# Patient Record
Sex: Male | Born: 1975 | Race: White | Hispanic: No | Marital: Single | Smoking: Current every day smoker
Health system: Southern US, Community
[De-identification: ages and names within clinical notes are randomized; demographics above are authoritative.]

## PROBLEM LIST (undated history)

## (undated) DIAGNOSIS — Z789 Other specified health status: Secondary | ICD-10-CM

## (undated) HISTORY — PX: NO PAST SURGERIES: SHX2092

## (undated) HISTORY — DX: Other specified health status: Z78.9

---

## 2007-09-16 ENCOUNTER — Ambulatory Visit: Payer: Self-pay | Admitting: Cardiology

## 2018-07-02 ENCOUNTER — Emergency Department (HOSPITAL_COMMUNITY)
Admission: EM | Admit: 2018-07-02 | Discharge: 2018-07-02 | Disposition: A | Discharge status: Home / Self Care | Payer: Self-pay | Attending: Emergency Medicine | Admitting: Emergency Medicine

## 2018-07-02 ENCOUNTER — Emergency Department (HOSPITAL_COMMUNITY): Payer: Self-pay

## 2018-07-02 ENCOUNTER — Other Ambulatory Visit: Payer: Self-pay

## 2018-07-02 ENCOUNTER — Encounter (HOSPITAL_COMMUNITY): Payer: Self-pay | Admitting: Emergency Medicine

## 2018-07-02 DIAGNOSIS — I300 Acute nonspecific idiopathic pericarditis: Secondary | ICD-10-CM | POA: Insufficient documentation

## 2018-07-02 DIAGNOSIS — F17210 Nicotine dependence, cigarettes, uncomplicated: Secondary | ICD-10-CM | POA: Insufficient documentation

## 2018-07-02 LAB — COMPREHENSIVE METABOLIC PANEL
ALT: 16 U/L (ref 0–44)
ANION GAP: 8 (ref 5–15)
AST: 14 U/L — ABNORMAL LOW (ref 15–41)
Albumin: 4 g/dL (ref 3.5–5.0)
Alkaline Phosphatase: 86 U/L (ref 38–126)
BUN: 17 mg/dL (ref 6–20)
CHLORIDE: 104 mmol/L (ref 98–111)
CO2: 27 mmol/L (ref 22–32)
Calcium: 8.8 mg/dL — ABNORMAL LOW (ref 8.9–10.3)
Creatinine, Ser: 0.75 mg/dL (ref 0.61–1.24)
GFR calc Af Amer: >60 mL/min (ref >60)
GFR calc non Af Amer: >60 mL/min (ref >60)
Glucose, Bld: 101 mg/dL — ABNORMAL HIGH (ref 70–99)
Potassium: 3.8 mmol/L (ref 3.5–5.1)
Sodium: 139 mmol/L (ref 135–145)
Total Bilirubin: 0.3 mg/dL (ref 0.3–1.2)
Total Protein: 6.8 g/dL (ref 6.5–8.1)

## 2018-07-02 LAB — CBC WITH DIFFERENTIAL/PLATELET
Abs Immature Granulocytes: 0.02 10*3/uL (ref 0.00–0.07)
Basophils Absolute: 0 10*3/uL (ref 0.0–0.1)
Basophils Relative: 0 %
EOS PCT: 3 %
Eosinophils Absolute: 0.3 10*3/uL (ref 0.0–0.5)
HCT: 53.1 % — ABNORMAL HIGH (ref 39.0–52.0)
Hemoglobin: 17.2 g/dL — ABNORMAL HIGH (ref 13.0–17.0)
Immature Granulocytes: 0 %
Lymphocytes Relative: 20 %
Lymphs Abs: 2.1 10*3/uL (ref 0.7–4.0)
MCH: 29.4 pg (ref 26.0–34.0)
MCHC: 32.4 g/dL (ref 30.0–36.0)
MCV: 90.6 fL (ref 80.0–100.0)
Monocytes Absolute: 0.6 10*3/uL (ref 0.1–1.0)
Monocytes Relative: 5 %
NRBC: 0 % (ref 0.0–0.2)
Neutro Abs: 7.4 10*3/uL (ref 1.7–7.7)
Neutrophils Relative %: 72 %
PLATELETS: 232 10*3/uL (ref 150–400)
RBC: 5.86 MIL/uL — ABNORMAL HIGH (ref 4.22–5.81)
RDW: 13.7 % (ref 11.5–15.5)
WBC: 10.5 10*3/uL (ref 4.0–10.5)

## 2018-07-02 LAB — TROPONIN I
Troponin I: <0.03 ng/mL (ref <0.03)
Troponin I: <0.03 ng/mL (ref <0.03)

## 2018-07-02 LAB — D-DIMER, QUANTITATIVE: D-Dimer, Quant: 0.58 ug/mL-FEU — ABNORMAL HIGH (ref 0.00–0.50)

## 2018-07-02 MED ORDER — KETOROLAC TROMETHAMINE 30 MG/ML IJ SOLN
30.0000 mg | Freq: Once | INTRAMUSCULAR | Status: AC
  Administered 2018-07-02: 30 mg via INTRAVENOUS
  Filled 2018-07-02: qty 1

## 2018-07-02 MED ORDER — IBUPROFEN 600 MG PO TABS: 600.0000 mg | ORAL_TABLET | Freq: Three times a day (TID) | ORAL | 0 refills | Status: DC

## 2018-07-02 MED ORDER — IOHEXOL 350 MG/ML SOLN
100.0000 mL | Freq: Once | INTRAVENOUS | Status: AC | PRN
  Administered 2018-07-02: 100 mL via INTRAVENOUS

## 2018-07-02 MED ORDER — COLCHICINE 0.6 MG PO TABS: 0.6000 mg | ORAL_TABLET | Freq: Every day | ORAL | 0 refills | Status: DC

## 2018-07-02 NOTE — Discharge Instructions (Signed)
Call and make an appointment to follow-up closely with cardiology.  Take medications as prescribed.  Return immediately for any worsening pain or concerns.

## 2018-07-02 NOTE — ED Notes (Signed)
Patient transported to CT 

## 2018-07-02 NOTE — ED Triage Notes (Signed)
Pt has been having central chest pain going through to back since yesterday at 10am worse with deep breathing.  Denies cough.

## 2018-07-02 NOTE — ED Provider Notes (Signed)
[facility] EMERGENCY DEPARTMENT Provider Note   CSN: [id number] Arrival date & time: 07/02/18  1724    History   Chief Complaint Chief Complaint  Patient presents with  . Chest Pain    HPI Brent Conley is a 43 y.o. male.  HPI: A 43 year old patient presents for evaluation of chest pain. Initial onset of pain was more than 6 hours ago. The patient's chest pain is sharp and is not worse with exertion. The patient's chest pain is not middle- or left-sided, is not well-localized, is not described as heaviness/pressure/tightness and does radiate to the arms/jaw/neck. The patient does not complain of nausea and denies diaphoresis. The patient has smoked in the past 90 days and has a family history of coronary artery disease in a first-degree relative with onset less than age 49. The patient has no history of stroke, has no history of peripheral artery disease, denies any history of treated diabetes, is not hypertensive, has no history of hypercholesterolemia and does not have an elevated BMI (>=30).   HPI Patient presents with central chest pain which he describes as sharp which started at 11 AM yesterday.  Gradually worsened throughout the day.  States the pain is worse with lying flat and with deep breathing.  Had some radiation of the pain up into his neck and left shoulder and arm.  Denies recent URI.  No fever or chills.  No new lower extremity swelling or pain.  States his father died of MI in his 9s.  No recent extended travel or immobilization.    History reviewed. No pertinent past medical history.  There are no active problems to display for this patient.   History reviewed. No pertinent surgical history.      Home Medications    Prior to Admission medications   Medication Sig Start Date End Date Taking? Authorizing Provider  aspirin 81 MG chewable tablet Chew 162 mg by mouth once as needed for mild pain or moderate pain.   Yes [provider]  colchicine 0.6 MG  tablet Take 1 tablet (0.6 mg total) by mouth daily. 07/02/18   Loren Racer, MD  ibuprofen (ADVIL,MOTRIN) 600 MG tablet Take 1 tablet (600 mg total) by mouth 3 (three) times daily after meals. 07/02/18   Loren Racer, MD    Family History History reviewed. No pertinent family history.  Social History Social History   Tobacco Use  . Smoking status: Current Every Day Smoker    Packs/day: 1.00    Types: Cigarettes  . Smokeless tobacco: Never Used  Substance Use Topics  . Alcohol use: Yes    Comment: once per month  . Drug use: Never     Allergies   Tylenol [acetaminophen]   Review of Systems Review of Systems  Constitutional: Negative for chills and fever.  HENT: Negative for sore throat and trouble swallowing.   Eyes: Negative for visual disturbance.  Respiratory: Positive for shortness of breath. Negative for cough.   Cardiovascular: Positive for chest pain. Negative for palpitations and leg swelling.  Gastrointestinal: Negative for abdominal pain, constipation, diarrhea, nausea and vomiting.  Genitourinary: Negative for dysuria, flank pain and frequency.  Musculoskeletal: Negative for back pain, myalgias and neck pain.  Skin: Negative for rash and wound.  Neurological: Negative for dizziness, weakness, light-headedness, numbness and headaches.  All other systems reviewed and are negative.    Physical Exam Updated Vital Signs BP 110/75   Pulse 78   Temp 98 F (36.7 C) (Oral)  Resp 20   Ht 5\' 9"  (1.753 m)   Wt 68.5 kg   SpO2 97%   BMI 22.30 kg/m   Physical Exam Vitals signs and nursing note reviewed.  Constitutional:      General: He is not in acute distress.    Appearance: Normal appearance. He is well-developed. He is not ill-appearing.  HENT:     Head: Normocephalic and atraumatic.     Nose: Nose normal. No congestion or rhinorrhea.     Mouth/Throat:     Mouth: Mucous membranes are moist.     Pharynx: No oropharyngeal exudate or posterior  oropharyngeal erythema.  Eyes:     Extraocular Movements: Extraocular movements intact.     Pupils: Pupils are equal, round, and reactive to light.  Neck:     Musculoskeletal: Normal range of motion and neck supple. No neck rigidity or muscular tenderness.  Cardiovascular:     Rate and Rhythm: Normal rate and regular rhythm.     Pulses: Normal pulses.     Heart sounds: No murmur. Friction rub present. No gallop.      Comments: Question faint friction rub  Pulmonary:     Effort: Pulmonary effort is normal. No respiratory distress.     Breath sounds: Normal breath sounds. No stridor. No wheezing, rhonchi or rales.  Chest:     Chest wall: No tenderness.  Abdominal:     General: Bowel sounds are normal.     Palpations: Abdomen is soft.     Tenderness: There is no abdominal tenderness. There is no right CVA tenderness, left CVA tenderness, guarding or rebound.  Musculoskeletal: Normal range of motion.        General: No swelling, tenderness, deformity or signs of injury.     Right lower leg: No edema.     Left lower leg: No edema.  Lymphadenopathy:     Cervical: No cervical adenopathy.  Skin:    General: Skin is warm and dry.     Capillary Refill: Capillary refill takes less than 2 seconds.     Findings: No erythema or rash.  Neurological:     General: No focal deficit present.     Mental Status: He is alert and oriented to person, place, and time.  Psychiatric:        Mood and Affect: Mood normal.        Behavior: Behavior normal.      ED Treatments / Results  Labs (all labs ordered are listed, but only abnormal results are displayed) Labs Reviewed  CBC WITH DIFFERENTIAL/PLATELET - Abnormal; Notable for the following components:      Result Value   RBC 5.86 (*)    Hemoglobin 17.2 (*)    HCT 53.1 (*)    All other components within normal limits  COMPREHENSIVE METABOLIC PANEL - Abnormal; Notable for the following components:   Glucose, Bld 101 (*)    Calcium 8.8 (*)     AST 14 (*)    All other components within normal limits  D-DIMER, QUANTITATIVE (NOT AT [facility]) - Abnormal; Notable for the following components:   D-Dimer, Quant 0.58 (*)    All other components within normal limits  TROPONIN I  TROPONIN I    EKG EKG Interpretation  Date/Time:  Saturday July 02 2018 17:26:33 EST Ventricular Rate:  82 PR Interval:    QRS Duration: 83 QT Interval:  366 QTC Calculation: 428 R Axis:   84 Text Interpretation:  Sinus rhythm ST elev, probable normal  early repol pattern No old tracing to compare Confirmed by Loren Racer ([id number]) on 07/02/2018 6:10:22 PM   Radiology Dg Chest 2 View  Result Date: 07/02/2018 CLINICAL DATA:  Per ED notes: Pt has been having central chest pain going through to back since yesterday at 10am worse with deep breathing. Denies cough. EXAM: CHEST - 2 VIEW COMPARISON:  None. FINDINGS: Cardiomediastinal silhouette is normal. Focal atelectasis at both lung bases. No consolidations or pleural effusions. No pulmonary edema. IMPRESSION: 1. Bibasilar atelectasis. 2. Lungs otherwise clear. Electronically Signed   By: Norva Pavlov M.D.   On: 07/02/2018 18:49   Ct Angio Chest Pe W And/or Wo Contrast  Result Date: 07/02/2018 CLINICAL DATA:  Chest pain. Intermediate probability. Positive D-dimer EXAM: CT ANGIOGRAPHY CHEST WITH CONTRAST TECHNIQUE: Multidetector CT imaging of the chest was performed using the standard protocol during bolus administration of intravenous contrast. Multiplanar CT image reconstructions and MIPs were obtained to evaluate the vascular anatomy. CONTRAST:  OMNIPAQUE IOHEXOL 350 MG/ML SOLN COMPARISON:  None FINDINGS: Cardiovascular: Heart size is normal. No pericardial effusion. Calcification in the LAD coronary artery noted. The main pulmonary artery appears patent. No saddle embolus. No central obstructing pulmonary emboli identified. No lobar or segmental pulmonary artery filling defects identified.  Mediastinum/Nodes: Normal appearance of the thyroid gland. The trachea appears patent and is midline. Normal appearance of the esophagus. Small mediastinal and hilar lymph nodes are identified. No adenopathy however no axillary or supraclavicular adenopathy. Lungs/Pleura: No pleural effusion. Ground-glass attenuation and interstitial accentuation within the posterior lung bases likely represent dependent changes. No significant pulmonary edema, airspace consolidation or atelectasis. Centrilobular and paraseptal emphysema noted. Upper Abdomen: No acute abnormality. Musculoskeletal: No chest wall abnormality. No acute or significant osseous findings. Review of the MIP images confirms the above findings. IMPRESSION: 1. No evidence for acute pulmonary emboli. 2. Dependent changes identified within both lung bases. 3. Emphysema 4. Lad coronary artery calcification. Electronically Signed   By: Signa Kell M.D.   On: 07/02/2018 19:49    Procedures Procedures (including critical care time)  Medications Ordered in ED Medications  iohexol (OMNIPAQUE) 350 MG/ML injection 100 mL (100 mLs Intravenous Contrast Given 07/02/18 1909)  ketorolac (TORADOL) 30 MG/ML injection 30 mg (30 mg Intravenous Given 07/02/18 2018)     Initial Impression / Assessment and Plan / ED Course  I have reviewed the triage vital signs and the nursing notes.  Pertinent labs & imaging results that were available during my care of the patient were reviewed by me and considered in my medical decision making (see chart for details).     HEAR Score: 2 Patient has significant risk factors for coronary artery disease including family history and long history of smoking cigarettes.  However his symptoms are pleuritic in nature and positional and most consistent with pericarditis.  Patient did have an elevation in his d-dimer and had a CT Angio of the chest which showed no evidence of PE or pericardial effusion.  He does have some  calcifications of his LAD.  Troponin x2 is normal.  His heart score is 2.  Will start on colchicine and NSAIDs and have him follow-up closely with cardiology.  He has been advised to quit smoking cigarettes.  Strict return precautions have been given.   Final Clinical Impressions(s) / ED Diagnoses   Final diagnoses:  Acute idiopathic pericarditis    ED Discharge Orders         Ordered    ibuprofen (ADVIL,MOTRIN) 600 MG tablet  3 times daily after meals     07/02/18 2136    colchicine 0.6 MG tablet  Daily     07/02/18 2136           Loren Racer, MD 07/02/18 2136

## 2018-07-20 ENCOUNTER — Telehealth: Payer: Self-pay | Admitting: Cardiology

## 2018-07-20 NOTE — Telephone Encounter (Signed)
Informed Brent Conley that pt needs to be evaluated in the ED. She voiced understanding and states she will inform the pt.

## 2018-07-20 NOTE — Telephone Encounter (Signed)
Pt's fiancee, Laqueta Carina, called wanting to know if the patient could be seen sooner. Stated last night he had pains in his chest that made it feel like it was going to "bust open". Patient refused to go to the ER last night per fiancee.

## 2018-07-20 NOTE — Telephone Encounter (Signed)
   By review of notes, this patient has never been evaluated in our office. Was in the ED on 07/02/2018 for chest pain and symptoms were thought to be most consistent with pericarditis. If symptoms have significantly worsened and he is no longer taking Colchicine, I would strongly recommend that he go to the ED for further evaluation. Please route to the DOD as well for further recommendations as he is a new patient.   Signed, Ellsworth Lennox, PA-C 07/20/2018, 10:13 AM Pager: [phone]

## 2018-07-20 NOTE — Telephone Encounter (Signed)
Spoke with Misty Stanley who states that pt is experiencing increased chest pain and SOB over the last 3 days. Misty Stanley states that pt refused to be seen in ED on last night. She reports that the patient is no longer taking colchicine because after taking one dose he started to itch and had hives. Misty Stanley is requesting a sooner visit in our office. Please advise.

## 2018-07-29 ENCOUNTER — Ambulatory Visit (INDEPENDENT_AMBULATORY_CARE_PROVIDER_SITE_OTHER): Payer: Self-pay | Admitting: Cardiology

## 2018-07-29 ENCOUNTER — Encounter: Payer: Self-pay | Admitting: Cardiology

## 2018-07-29 ENCOUNTER — Ambulatory Visit (HOSPITAL_COMMUNITY)
Admission: RE | Admit: 2018-07-29 | Discharge: 2018-07-29 | Disposition: A | Discharge status: Home / Self Care | Payer: Self-pay | Source: Ambulatory Visit | Attending: Cardiology | Admitting: Cardiology

## 2018-07-29 ENCOUNTER — Other Ambulatory Visit: Payer: Self-pay

## 2018-07-29 VITALS — BP 102/67 | HR 70 | Ht 69.0 in | Wt 173.0 lb

## 2018-07-29 DIAGNOSIS — R072 Precordial pain: Secondary | ICD-10-CM | POA: Insufficient documentation

## 2018-07-29 DIAGNOSIS — Z720 Tobacco use: Secondary | ICD-10-CM

## 2018-07-29 DIAGNOSIS — Z8249 Family history of ischemic heart disease and other diseases of the circulatory system: Secondary | ICD-10-CM

## 2018-07-29 DIAGNOSIS — R079 Chest pain, unspecified: Secondary | ICD-10-CM

## 2018-07-29 LAB — ECHOCARDIOGRAM COMPLETE
Height: 69 in
Weight: 2768 oz

## 2018-07-29 NOTE — Patient Instructions (Signed)
Medication Instructions:  Stop prednisone   Start aspirin 325 mg daily   Labwork: none  Testing/Procedures: Your physician has requested that you have an echocardiogram. Echocardiography is a painless test that uses sound waves to create images of your heart. It provides your doctor with information about the size and shape of your heart and how well your heart's chambers and valves are working. This procedure takes approximately one hour. There are no restrictions for this procedure.  Your physician has requested that you have en exercise stress myoview. For further information please visit [email or web address] Please follow instruction sheet, as given.    Follow-Up: Your physician recommends that you schedule a follow-up appointment in:  Pending test results (we will call with results )    Any Other Special Instructions Will Be Listed Below (If Applicable).     If you need a refill on your cardiac medications before your next appointment, please call your pharmacy.

## 2018-07-29 NOTE — Progress Notes (Signed)
Cardiology Office Note  Date: 07/29/2018   ID: DOLORES BALDERA, DOB December 11, 1975, MRN [id number]  PCP: Lauriann Milillo Jester, DO  Consulting Cardiologist: Nona Dell, MD   Chief Complaint  Patient presents with  . Chest Pain    History of Present Illness: Brent Conley is a 43 y.o. male referred for cardiology consultation by Dr. Ranae Palms after ER visit in late February with chest discomfort.  Chest pain symptoms were reported to be pleuritic in nature and he was diagnosed with possible pericarditis at ER encounter.  Troponin I level was negative x2.  D-dimer level was increased to 0.58.  Chest CT angiography was negative for pulmonary embolus showing dependent atelectasis and emphysematous changes, also incidental coronary calcification in the LAD distribution.  ECG showed sinus rhythm with probable early repolarization.  He presents today with his girlfriend.  He tells me that approximately 3 weeks ago he developed the onset of chest tightness while he was at work sitting in a truck, no specific activity.  This waxed and waned over a period of a few days and became worse waking him up in the middle of the night.  After assessment in the ER he was placed on ibuprofen and colchicine, although was only able to take 1 dose of colchicine before developing hives and discontinuing the medication.  He did not take ibuprofen regularly either.  He did use some of his girlfriends oxycodone which seemed to help, and also took some of her prednisone as well, although has not been consistent with either medication.  Over the last few weeks he has continued to have episodic chest tightness, not continuous or positional.  This is been worse with heavy lifting.  He works with Therapist, music, has been out of work recently.  He has a family history of premature CAD in his father and brother.  He does not report any other known medical conditions but does have a history of tobacco abuse.  Repeat  ECG today shows sinus rhythm with nonspecific T wave changes.  Past Medical History:  Diagnosis Date  . Known health problems: none     Past Surgical History:  Procedure Laterality Date  . NO PAST SURGERIES      Current Outpatient Medications  Medication Sig Dispense Refill  . aspirin 325 MG EC tablet Take 325 mg by mouth daily.    . predniSONE (DELTASONE) 20 MG tablet Take 20 mg by mouth 2 (two) times daily with a meal.     No current facility-administered medications for this visit.    Allergies:  Colchicine and Tylenol [acetaminophen]   Social History: The patient  reports that he has been smoking cigarettes. He has been smoking about 1.00 pack per day. He has never used smokeless tobacco. He reports previous alcohol use. He reports that he does not use drugs.  Family History: The patient's family history includes COPD in his father; Heart attack in his brother and father.   ROS:  Please see the history of present illness. Otherwise, complete review of systems is positive for none.  All other systems are reviewed and negative.   Physical Exam: VS:  BP 102/67   Pulse 70   Ht 5\' 9"  (1.753 m)   Wt 173 lb (78.5 kg)   SpO2 97%   BMI 25.55 kg/m , BMI Body mass index is 25.55 kg/m.  Wt Readings from Last 3 Encounters:  07/29/18 173 lb (78.5 kg)  07/02/18 151 lb (68.5 kg)  General: Patient appears comfortable at rest. HEENT: Conjunctiva and lids normal, oropharynx clear. Neck: Supple, no elevated JVP or carotid bruits, no thyromegaly. Lungs: Clear to auscultation, nonlabored breathing at rest. Cardiac: Regular rate and rhythm, no S3 or significant systolic murmur, no pericardial rub. Abdomen: Soft, nontender, bowel sounds present, no guarding or rebound. Extremities: No pitting edema, distal pulses 2+. Skin: Warm and dry. Musculoskeletal: No kyphosis. Neuropsychiatric: Alert and oriented x3, affect grossly appropriate.  ECG: There are no old tracings for comparison.   Recent Labwork: 07/02/2018: ALT 16; AST 14; BUN 17; Creatinine, Ser 0.75; Hemoglobin 17.2; Platelets 232; Potassium 3.8; Sodium 139   Other Studies Reviewed Today:  Chest CTA 07/02/2018: FINDINGS: Cardiovascular: Heart size is normal. No pericardial effusion. Calcification in the LAD coronary artery noted.  The main pulmonary artery appears patent. No saddle embolus. No central obstructing pulmonary emboli identified. No lobar or segmental pulmonary artery filling defects identified.  Mediastinum/Nodes: Normal appearance of the thyroid gland. The trachea appears patent and is midline. Normal appearance of the esophagus. Small mediastinal and hilar lymph nodes are identified. No adenopathy however no axillary or supraclavicular adenopathy.  Lungs/Pleura: No pleural effusion. Ground-glass attenuation and interstitial accentuation within the posterior lung bases likely represent dependent changes. No significant pulmonary edema, airspace consolidation or atelectasis. Centrilobular and paraseptal emphysema noted.  Upper Abdomen: No acute abnormality.  Musculoskeletal: No chest wall abnormality. No acute or significant osseous findings.  Review of the MIP images confirms the above findings.  IMPRESSION: 1. No evidence for acute pulmonary emboli. 2. Dependent changes identified within both lung bases. 3. Emphysema 4. Lad coronary artery calcification.  Assessment and Plan:  1.  Intermittent chest tightness over the last 3 weeks.  Patient was initially diagnosed with pericarditis following ER visit in late February at which point troponin I levels were negative, ECG showed probable early repolarization changes.  His symptoms do not necessarily sound typical for pericarditis, description now is more of an exertional component and he does have cardiac risk factors including gender, tobacco abuse, and family history of premature CAD in first-degree male relatives.  He also had  incidentally noted coronary artery calcification in the LAD distribution by chest CT.  I have asked that he stop taking his girlfriend's prednisone for now, will start aspirin 325 mg once a day and proceed with further ischemic evaluation.  Echocardiogram and exercise Myoview will be obtained to help guide the next step in his management.  2.  Ongoing tobacco abuse, smoking cessation discussed.  Current medicines were reviewed with the patient today.   Orders Placed This Encounter  Procedures  . NM Myocar Multi W/Spect W/Wall Motion / EF  . EKG 12-Lead  . ECHOCARDIOGRAM COMPLETE    Disposition: Call with test results and further recommendations.  Signed, Jonelle Sidle, MD, [facility] 07/29/2018 11:36 AM    [facility] [address] [place], [place] [address] Phone: [phone]; Fax: [phone]

## 2018-07-29 NOTE — Progress Notes (Signed)
*  PRELIMINARY RESULTS* Echocardiogram 2D Echocardiogram has been performed.  Stacey Drain 07/29/2018, 12:32 PM

## 2018-08-01 ENCOUNTER — Other Ambulatory Visit: Payer: Self-pay

## 2018-08-01 ENCOUNTER — Telehealth: Payer: Self-pay | Admitting: *Deleted

## 2018-08-01 ENCOUNTER — Encounter (HOSPITAL_COMMUNITY)
Admission: RE | Admit: 2018-08-01 | Discharge: 2018-08-01 | Disposition: A | Discharge status: Home / Self Care | Payer: Self-pay | Source: Ambulatory Visit | Attending: Cardiology | Admitting: Cardiology

## 2018-08-01 ENCOUNTER — Telehealth: Payer: Self-pay

## 2018-08-01 ENCOUNTER — Ambulatory Visit (HOSPITAL_COMMUNITY)
Admission: RE | Admit: 2018-08-01 | Discharge: 2018-08-01 | Disposition: A | Discharge status: Home / Self Care | Payer: Self-pay | Source: Ambulatory Visit | Attending: Cardiology | Admitting: Cardiology

## 2018-08-01 DIAGNOSIS — R072 Precordial pain: Secondary | ICD-10-CM | POA: Insufficient documentation

## 2018-08-01 DIAGNOSIS — R079 Chest pain, unspecified: Secondary | ICD-10-CM | POA: Insufficient documentation

## 2018-08-01 LAB — NM MYOCAR MULTI W/SPECT W/WALL MOTION / EF
CHL CUP NUCLEAR SSS: 1
CSEPED: 11 min
Estimated workload: 13.4 METS
Exercise duration (sec): 17 s
LV dias vol: 100 mL (ref 62–150)
LV sys vol: 43 mL
MPHR: 177 {beats}/min
NUC STRESS TID: 1.04
Peak HR: 157 {beats}/min
Percent HR: 88 %
RATE: 0.41
RPE: 15
Rest HR: 59 {beats}/min
SDS: 1
SRS: 0

## 2018-08-01 MED ORDER — REGADENOSON 0.4 MG/5ML IV SOLN
INTRAVENOUS | Status: AC
  Filled 2018-08-01: qty 5

## 2018-08-01 MED ORDER — TECHNETIUM TC 99M TETROFOSMIN IV KIT
10.0000 | PACK | Freq: Once | INTRAVENOUS | Status: AC | PRN
  Administered 2018-08-01: 10.5 via INTRAVENOUS

## 2018-08-01 MED ORDER — TECHNETIUM TC 99M TETROFOSMIN IV KIT
30.0000 | PACK | Freq: Once | INTRAVENOUS | Status: AC | PRN
  Administered 2018-08-01: 31 via INTRAVENOUS

## 2018-08-01 MED ORDER — SODIUM CHLORIDE 0.9% FLUSH
INTRAVENOUS | Status: AC
  Administered 2018-08-01: 10 mL via INTRAVENOUS
  Filled 2018-08-01: qty 10

## 2018-08-01 NOTE — Telephone Encounter (Signed)
-----   Message from Jonelle Sidle, MD sent at 08/01/2018  1:04 PM EDT ----- Results reviewed.  Please let him know that the stress test was overall low risk, argues against presence of obstructive CAD as cause of his chest pain.  In that case, let's pursue a treatment course for pericarditis since he never did complete this initially.  We cannot use colchicine in light of reported allergic reaction.  Suggest ibuprofen 600 mg p.o. 3 times daily for a 10-day course.  He should stop ASA. He should take omeprazole which he can get over-the-counter during the time that he is on ibuprofen to protect his stomach.  We can schedule a follow-up phone telehealth visit in a few weeks after he has completed his treatment to see how things are going. A copy of this test should be forwarded to Samuel Jester, DO.

## 2018-08-01 NOTE — Telephone Encounter (Signed)
Called pt. No answer. Left message for pt to return call.  

## 2018-08-01 NOTE — Telephone Encounter (Signed)
Called patient with test results. No answer. Left message to call back.  

## 2018-08-01 NOTE — Telephone Encounter (Signed)
-----   Message from Jonelle Sidle, MD sent at 07/29/2018  3:44 PM EDT ----- Results reviewed.  Normal LVEF at 55 to 60% without obvious wall motion abnormalities.  Overall reassuring.  Study does not exclude the possibility that he could have had pericarditis.  Still important to pursue stress testing as discussed in consultation note. A copy of this test should be forwarded to Samuel Jester, DO.

## 2018-08-02 ENCOUNTER — Telehealth: Payer: Self-pay

## 2018-08-02 MED ORDER — IBUPROFEN 600 MG PO TABS: ORAL_TABLET | ORAL | 0 refills | Status: AC

## 2018-08-02 NOTE — Telephone Encounter (Signed)
-----   Message from Samuel G McDowell, MD sent at 08/01/2018  1:04 PM EDT ----- Results reviewed.  Please let him know that the stress test was overall low risk, argues against presence of obstructive CAD as cause of his chest pain.  In that case, let's pursue a treatment course for pericarditis since he never did complete this initially.  We cannot use colchicine in light of reported allergic reaction.  Suggest ibuprofen 600 mg p.o. 3 times daily for a 10-day course.  He should stop ASA. He should take omeprazole which he can get over-the-counter during the time that he is on ibuprofen to protect his stomach.  We can schedule a follow-up phone telehealth visit in a few weeks after he has completed his treatment to see how things are going. A copy of this test should be forwarded to Butler, Cynthia, DO. 

## 2018-08-02 NOTE — Telephone Encounter (Signed)
I gave results to both patient and wife.He will stop ASA and take Ibuprofen as directed.He will call back after 10 day therapy and we will schedule tele apt then

## 2019-10-23 IMAGING — DX DG CHEST 2V
2 series · 2 of 2 positions shown · non-contrast
Comparison: None.

CLINICAL DATA: Per ED notes: Pt has been having central chest pain
going through to back since yesterday at 10am worse with deep
breathing. Denies cough.

EXAM:
CHEST - 2 VIEW

[chest pa]
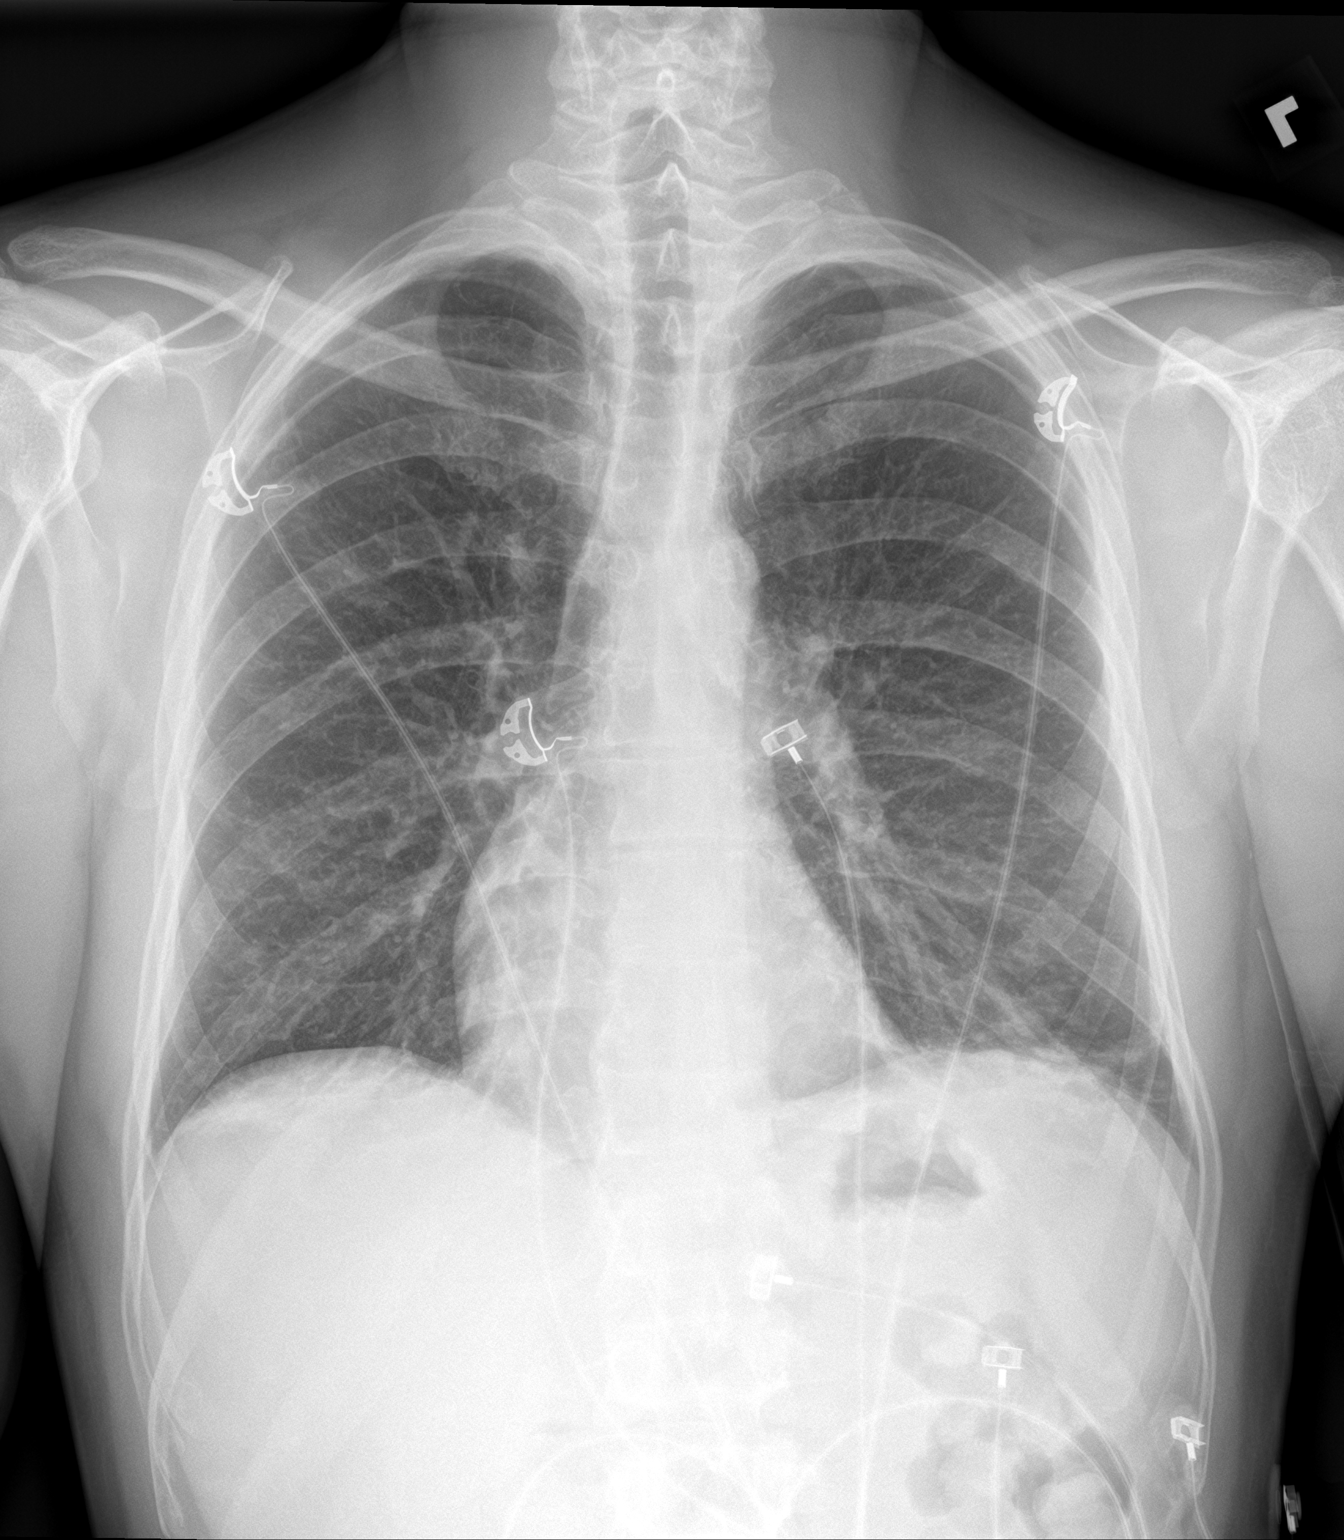

[chest lat]
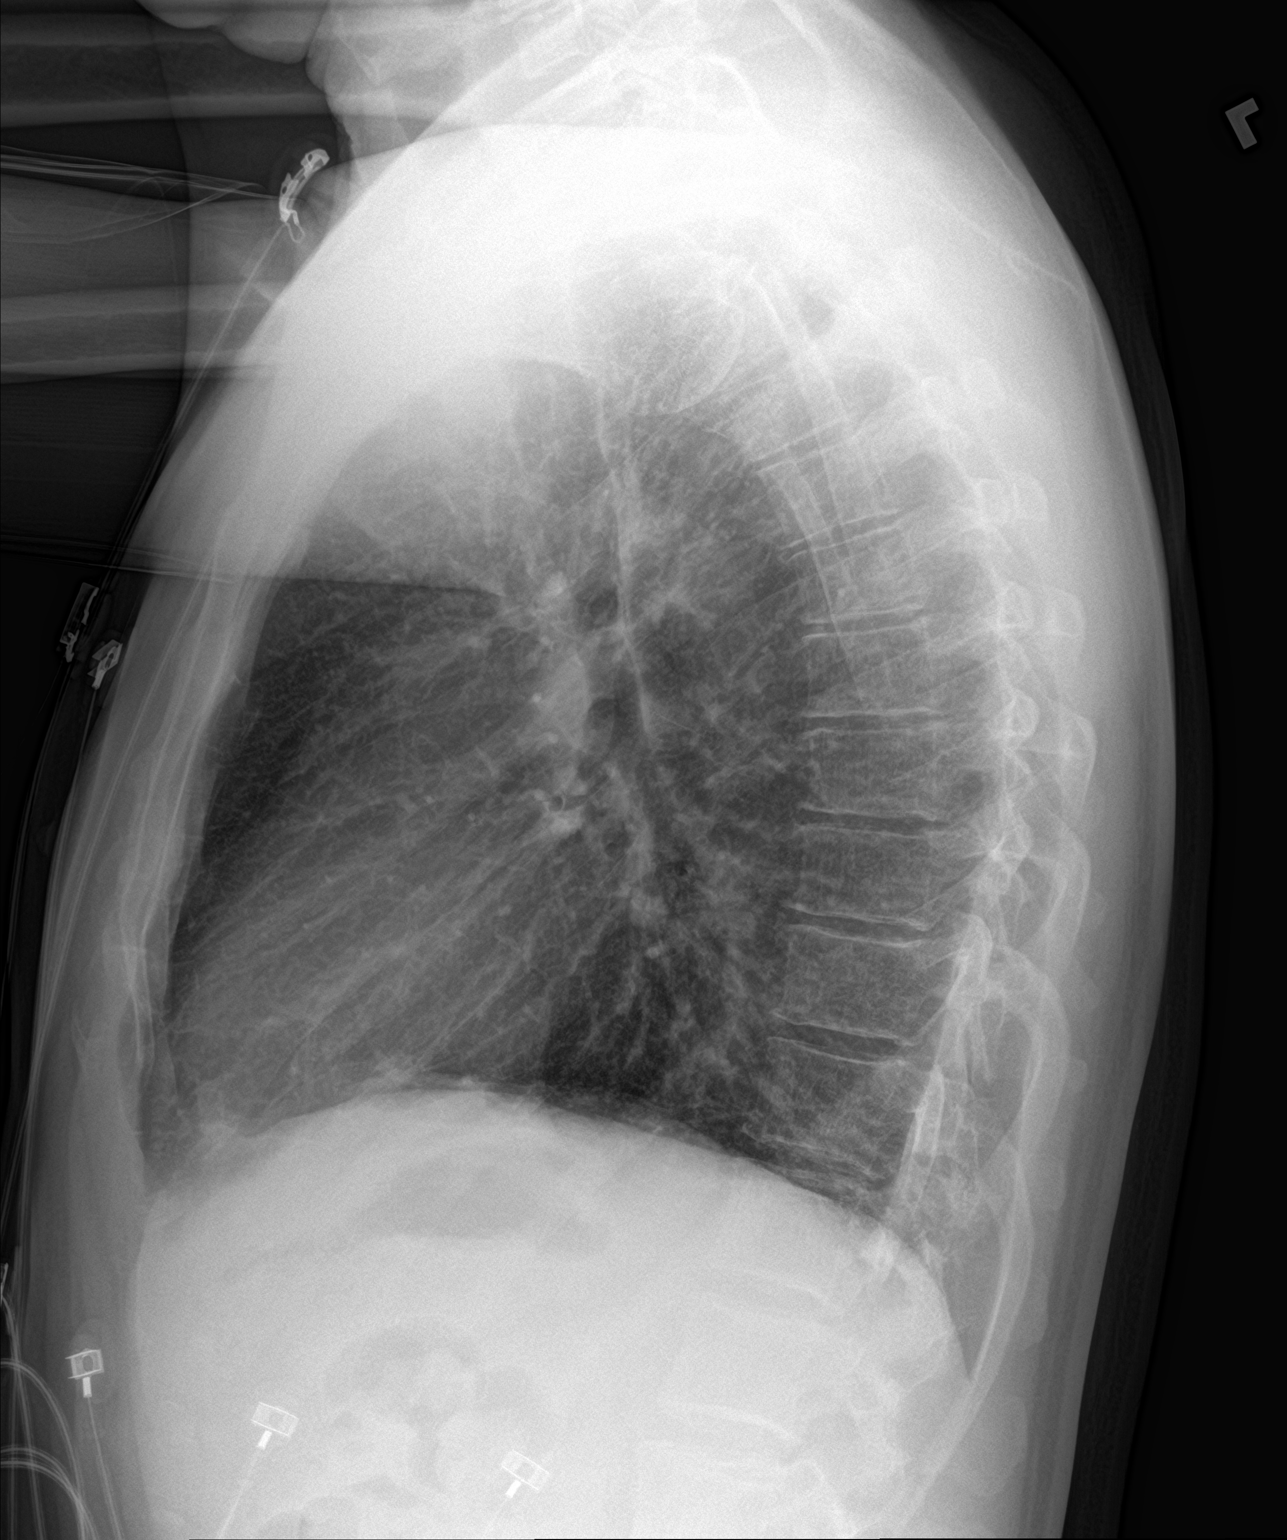

[2 of 2 positions shown; findings below may reference images not displayed]

FINDINGS: Cardiomediastinal silhouette is normal. Focal atelectasis at both
lung bases. No consolidations or pleural effusions. No pulmonary
edema.
IMPRESSION: 1. Bibasilar atelectasis.
2. Lungs otherwise clear.

## 2019-10-23 IMAGING — CT CT ANGIO CHEST
2 of 7 series · 18 of 46 positions shown · IV contrast (Isovue)
Comparison: None

CLINICAL DATA: Chest pain. Intermediate probability. Positive
D-dimer

EXAM:
CT ANGIOGRAPHY CHEST WITH CONTRAST
TECHNIQUE: Multidetector CT imaging of the chest was performed using the
standard protocol during bolus administration of intravenous
contrast. Multiplanar CT image reconstructions and MIPs were
obtained to evaluate the vascular anatomy.
CONTRAST:  100mL OMNIPAQUE IOHEXOL 350 MG/ML SOLN

[Series 5: thins · axial · 0.70mm/px · z∈[-38,+216]mm · 15 of 292 slices shown]
[im 19/292  lung]
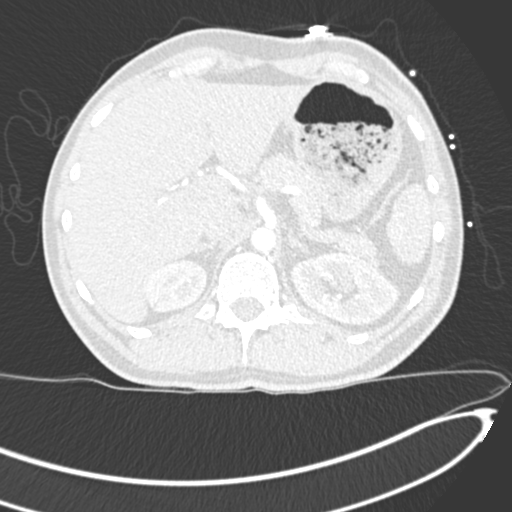
[im 37/292  soft-tissue]
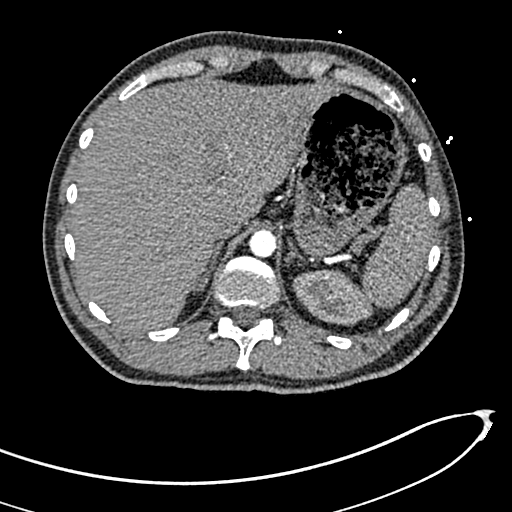
[im 55/292  lung]
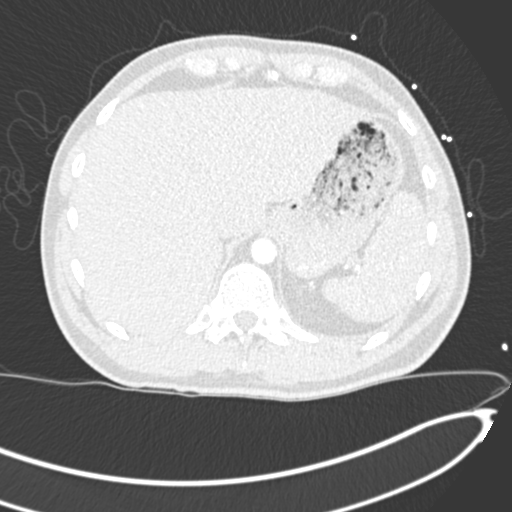
[im 73/292  soft-tissue]
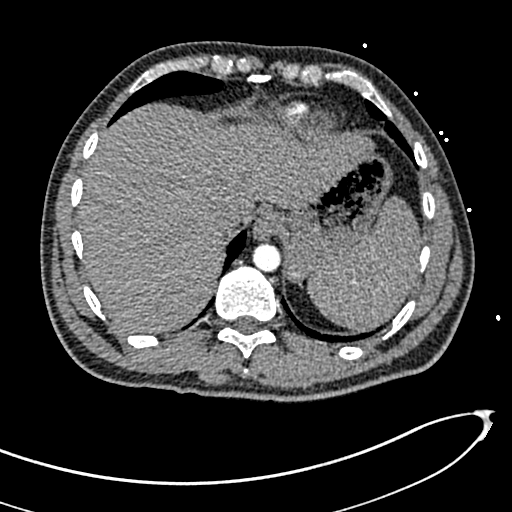
[im 91/292  lung]
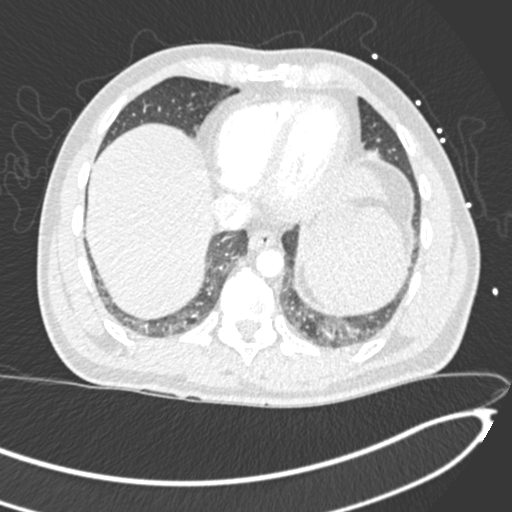
[im 110/292  soft-tissue]
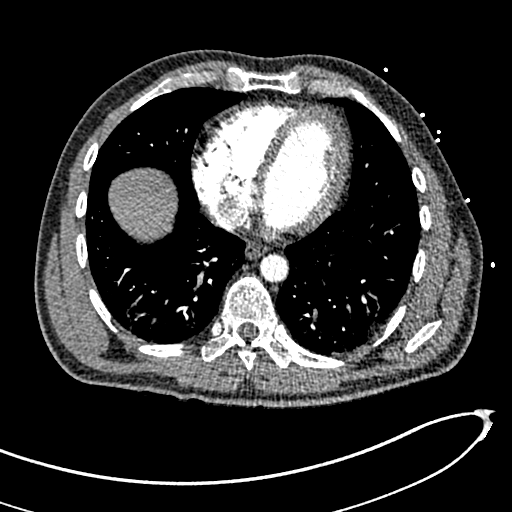
[im 128/292  lung]
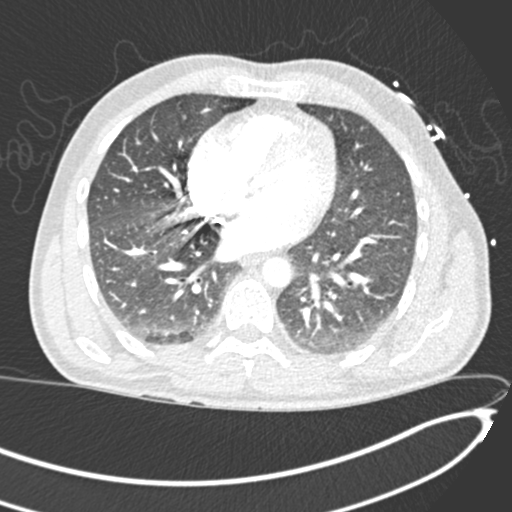
[im 146/292  soft-tissue]
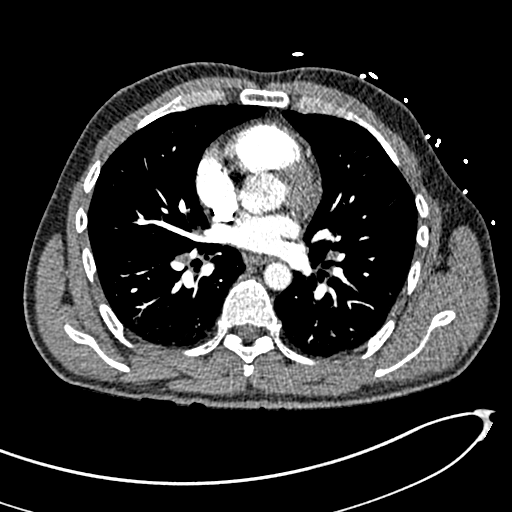
[im 164/292  lung]
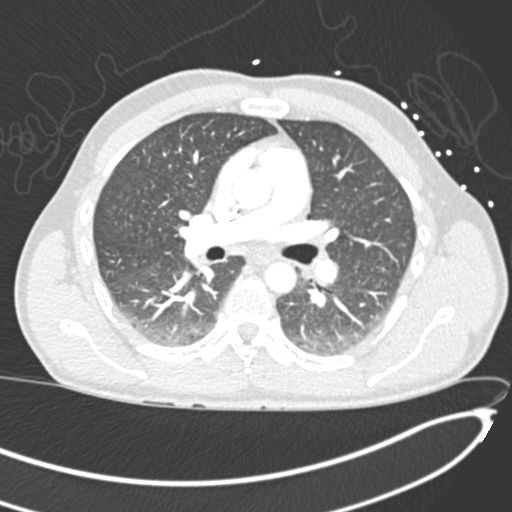
[im 182/292  soft-tissue]
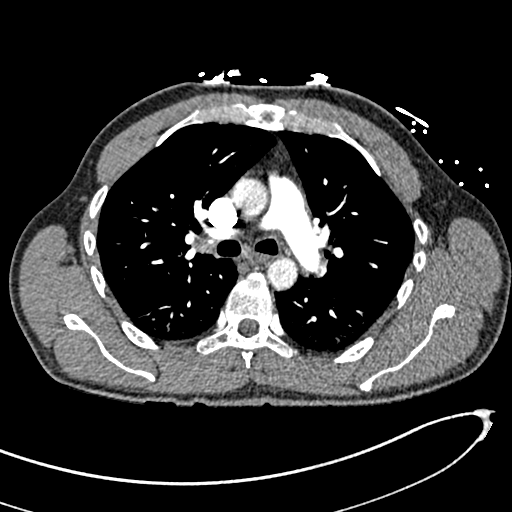
[im 201/292  lung]
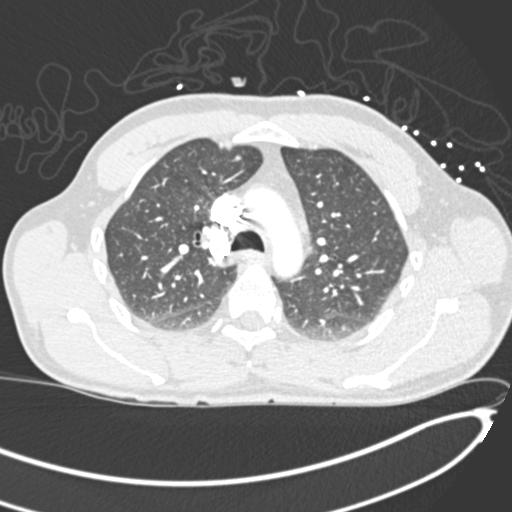
[im 219/292  soft-tissue]
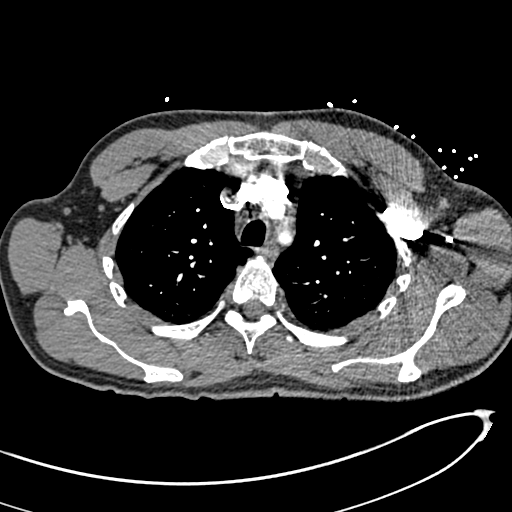
[im 237/292  lung]
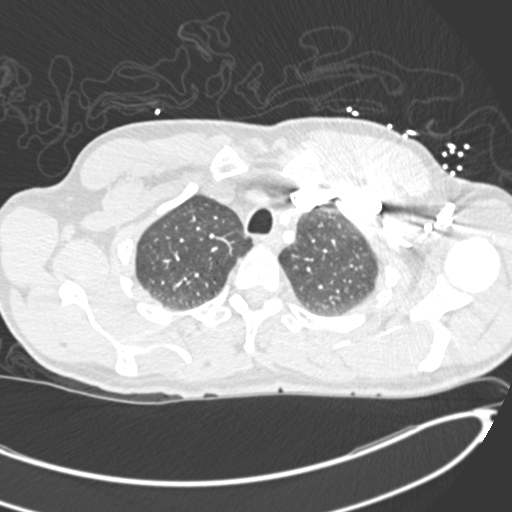
[im 255/292  soft-tissue]
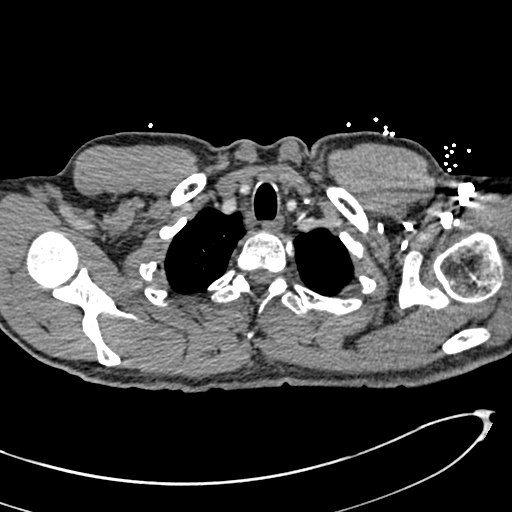
[im 273/292  lung]
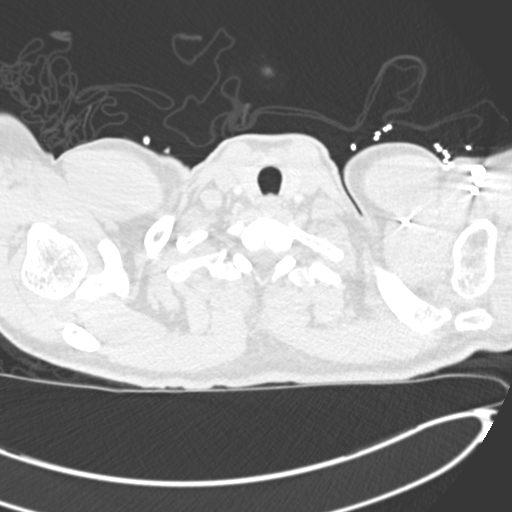

[Series 7: coronal mpr · coronal · 0.60mm/px · 3 of 140 slices shown]
[im 35/140  soft-tissue]
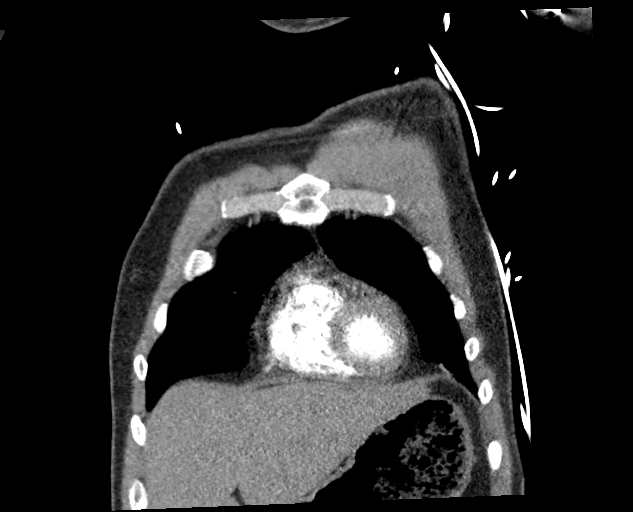
[im 70/140  soft-tissue]
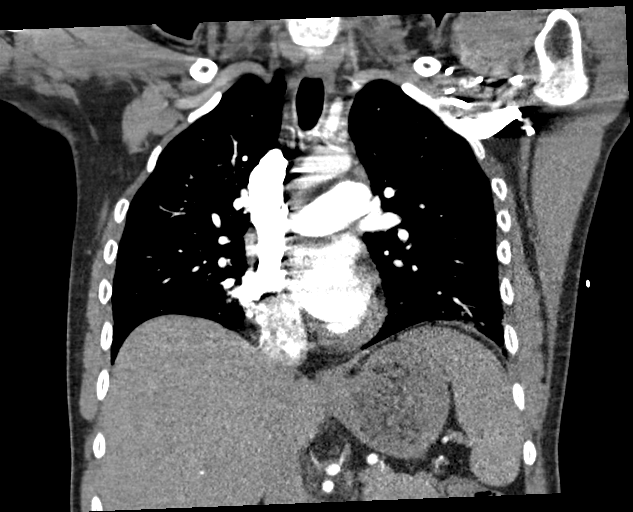
[im 105/140  soft-tissue]
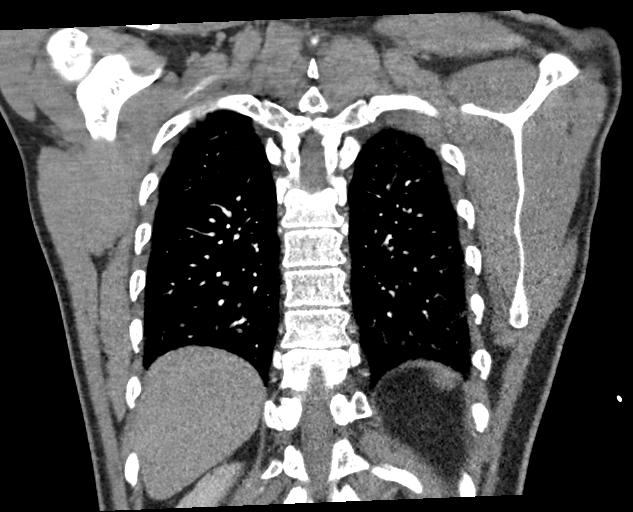

[18 of 46 positions shown; findings below may reference images not displayed]

FINDINGS: Cardiovascular: Heart size is normal. No pericardial effusion.
Calcification in the LAD coronary artery noted.

The main pulmonary artery appears patent. No saddle embolus. No
central obstructing pulmonary emboli identified. No lobar or
segmental pulmonary artery filling defects identified.

Mediastinum/Nodes: Normal appearance of the thyroid gland. The
trachea appears patent and is midline. Normal appearance of the
esophagus. Small mediastinal and hilar lymph nodes are identified.
No adenopathy however no axillary or supraclavicular adenopathy.

Lungs/Pleura: No pleural effusion. Ground-glass attenuation and
interstitial accentuation within the posterior lung bases likely
represent dependent changes. No significant pulmonary edema,
airspace consolidation or atelectasis. Centrilobular and paraseptal
emphysema noted.

Upper Abdomen: No acute abnormality.

Musculoskeletal: No chest wall abnormality. No acute or significant
osseous findings.

Review of the MIP images confirms the above findings.
IMPRESSION: 1. No evidence for acute pulmonary emboli.
2. Dependent changes identified within both lung bases.
3. Emphysema
4. Lad coronary artery calcification.
